# Patient Record
Sex: Female | Born: 1960 | Race: White | Hispanic: No | Marital: Single | Smoking: Never smoker
Health system: Southern US, Community
[De-identification: ages and names within clinical notes are randomized; demographics above are authoritative.]

## PROBLEM LIST (undated history)

## (undated) DIAGNOSIS — F32A Depression, unspecified: Secondary | ICD-10-CM

## (undated) DIAGNOSIS — I10 Essential (primary) hypertension: Secondary | ICD-10-CM

## (undated) DIAGNOSIS — F329 Major depressive disorder, single episode, unspecified: Secondary | ICD-10-CM

## (undated) DIAGNOSIS — K219 Gastro-esophageal reflux disease without esophagitis: Secondary | ICD-10-CM

## (undated) DIAGNOSIS — G629 Polyneuropathy, unspecified: Secondary | ICD-10-CM

---

## 2009-02-24 ENCOUNTER — Ambulatory Visit (HOSPITAL_COMMUNITY): Admission: RE | Admit: 2009-02-24 | Discharge: 2009-02-24 | Payer: Self-pay | Admitting: Gastroenterology

## 2010-10-02 IMAGING — US US ABDOMEN COMPLETE
1 series · 14 of 25 positions shown · non-contrast
Comparison: None

CLINICAL DATA: Nausea and upper abdominal pain.

COMPLETE ABDOMINAL ULTRASOUND

[Series 1: us abdomen complete · 0.28mm/px · 14 of 89 slices shown]
[im 1/89]
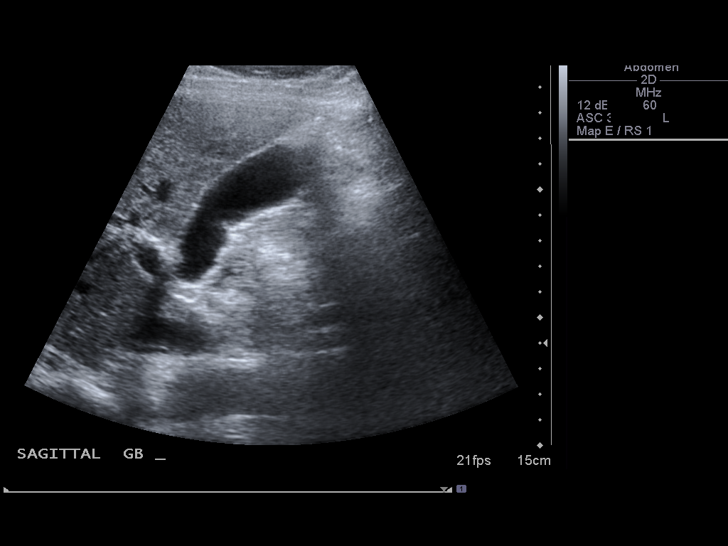
[im 8/89]
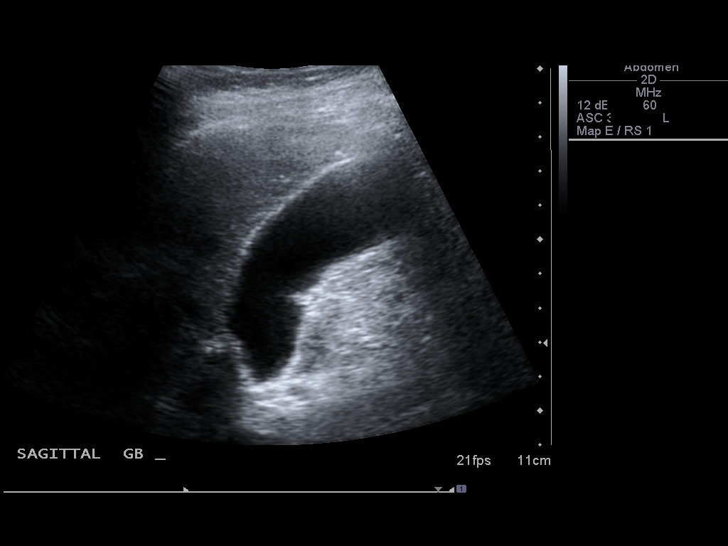
[im 15/89]
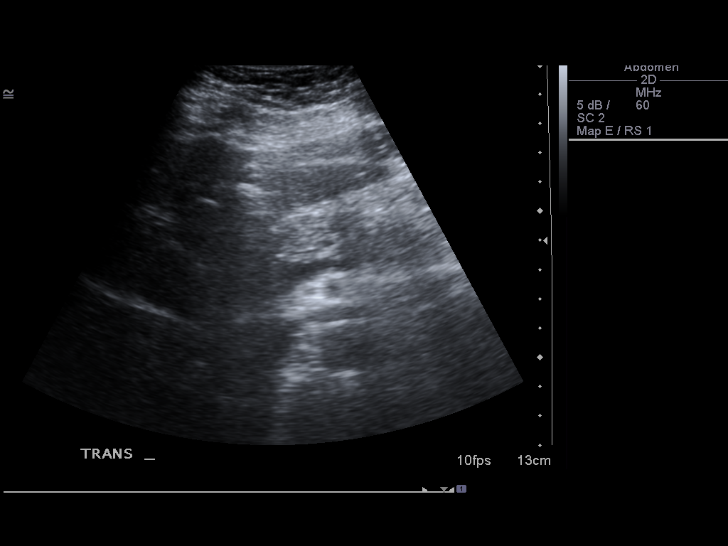
[im 23/89]
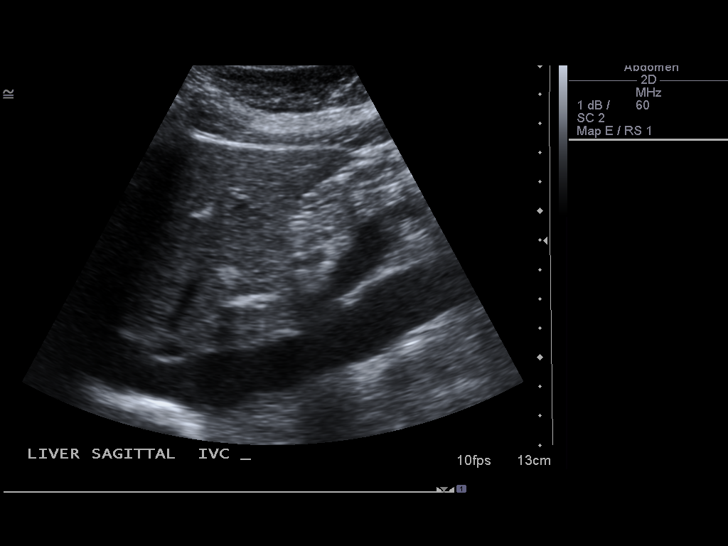
[im 30/89]
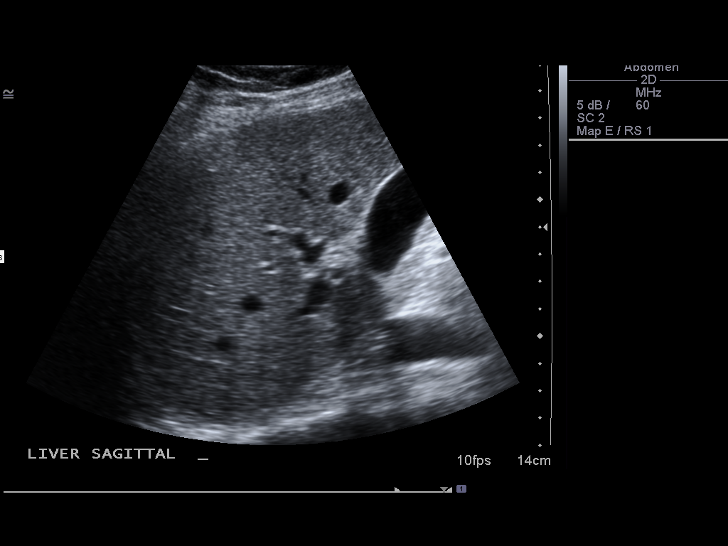
[im 34/89]
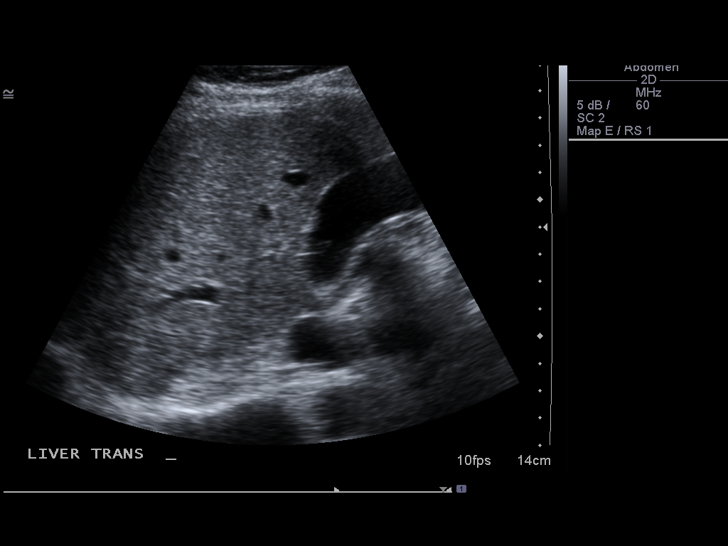
[im 41/89]
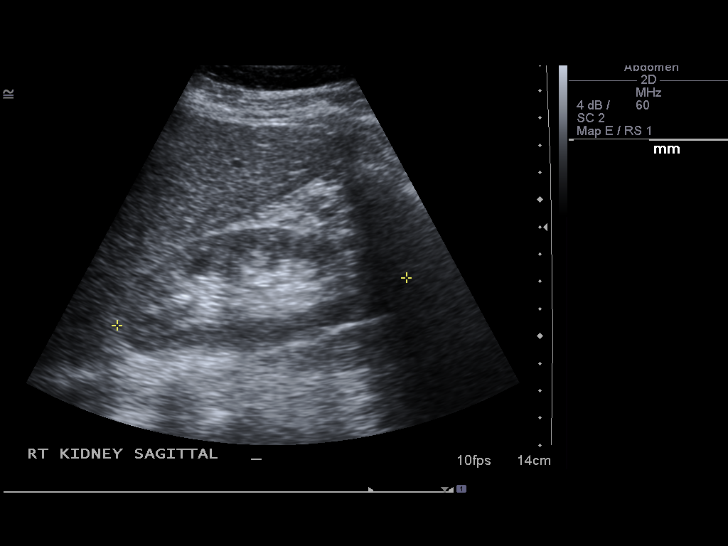
[im 48/89]
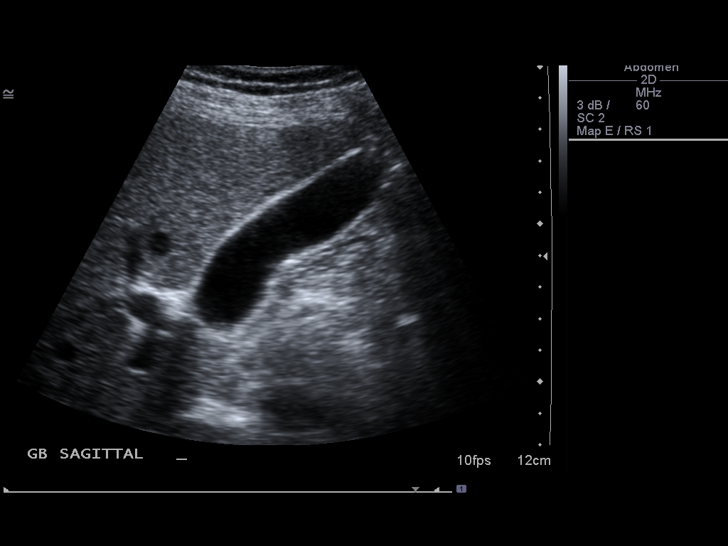
[im 56/89]
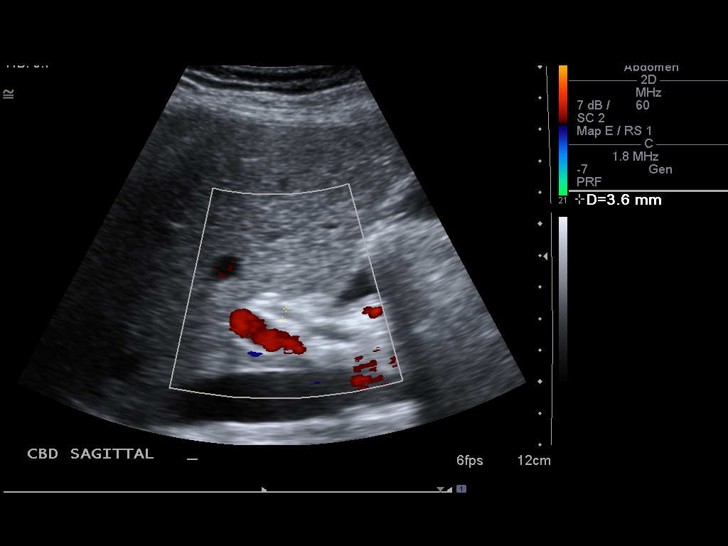
[im 59/89]
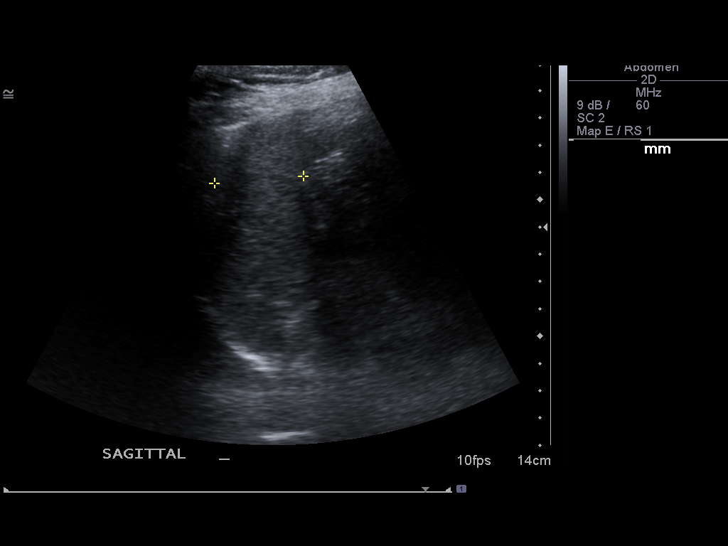
[im 67/89]
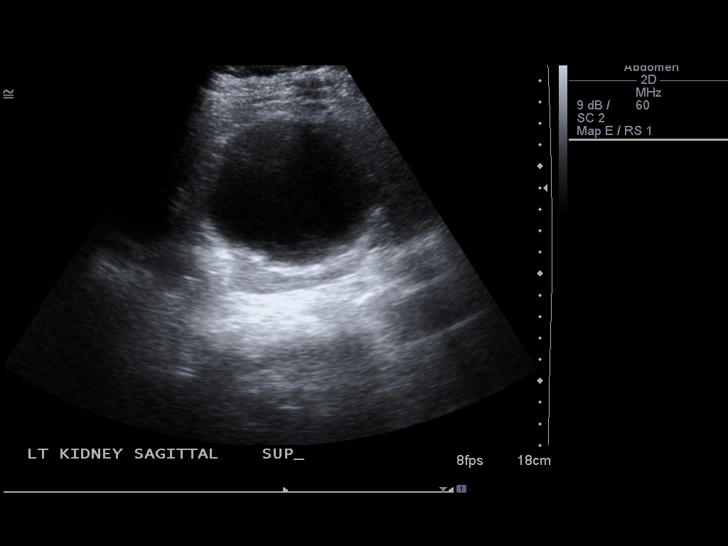
[im 74/89]
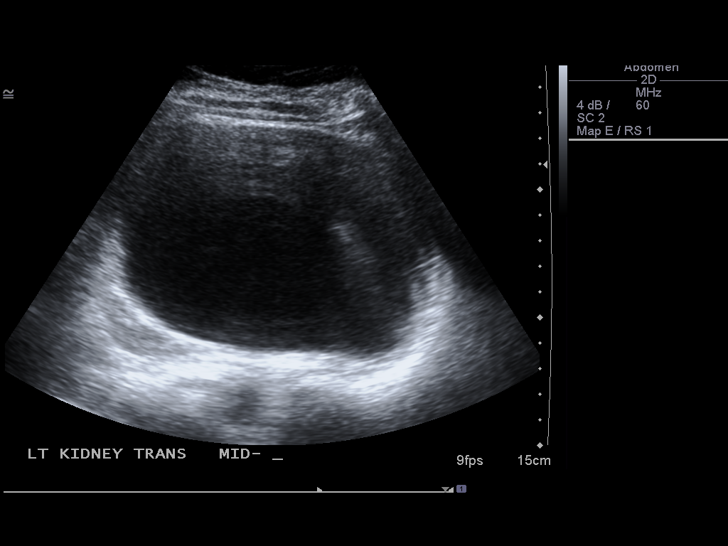
[im 81/89]
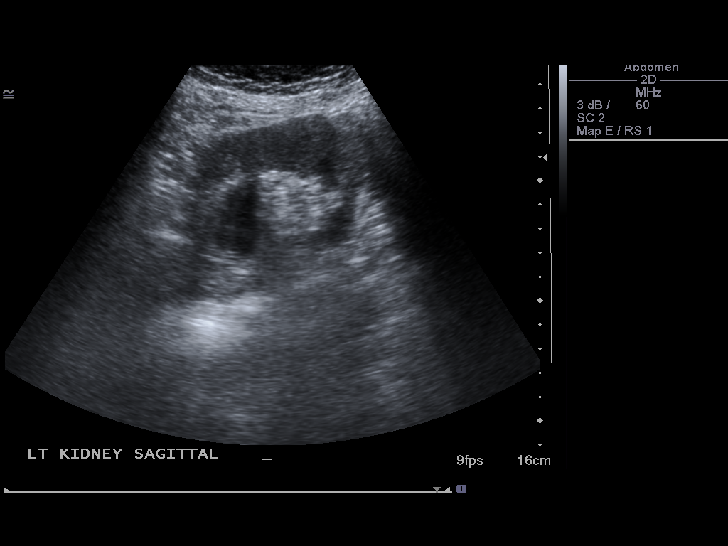
[im 89/89]
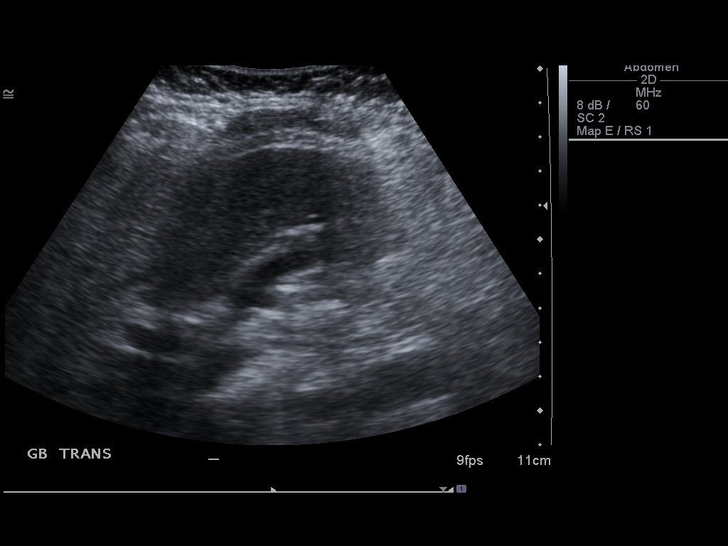

[14 of 25 positions shown; findings below may reference images not displayed]

FINDINGS: Gallbladder:  There is a prominent fold in the gallbladder but no
gallstones, wall thickening or pericholecystic fluid.  Negative
sonographic Murphy's sign.

Common bile duct:  Normal in caliber measuring a maximum of 3.6 mm.

Liver:  Normal echogenicity without focal lesions or intrahepatic
ductal dilatation.

IVC:  Normal caliber.

Pancreas:  Sonographically normal.

Spleen:  Normal in size and echogenicity without focal lesions.

Right Kidney:  10.8 cm in length. Normal renal cortical thickness
and echogenicity without focal lesions or hydronephrosis.

Left Kidney:  14.5 cm in length.  There is a 14 x 8 x 12 cm simple
appearing renal cyst.  No hydronephrosis.

Abdominal aorta:  Normal in caliber.
IMPRESSION: 1.  Normal sonographic appearance of the gallbladder and normal
caliber common bile duct.
2.  Normal sonographic appearance of the liver, pancreas, spleen
and right kidney.
3.  14 cm cyst associated with the left kidney.

## 2015-10-20 ENCOUNTER — Encounter (HOSPITAL_COMMUNITY): Payer: Self-pay

## 2015-10-20 ENCOUNTER — Emergency Department (HOSPITAL_COMMUNITY)
Admission: EM | Admit: 2015-10-20 | Discharge: 2015-10-21 | Disposition: A | Discharge status: Home / Self Care | Attending: Emergency Medicine | Admitting: Emergency Medicine

## 2015-10-20 ENCOUNTER — Emergency Department (HOSPITAL_COMMUNITY)

## 2015-10-20 DIAGNOSIS — Y999 Unspecified external cause status: Secondary | ICD-10-CM | POA: Insufficient documentation

## 2015-10-20 DIAGNOSIS — Y9301 Activity, walking, marching and hiking: Secondary | ICD-10-CM | POA: Insufficient documentation

## 2015-10-20 DIAGNOSIS — Y92009 Unspecified place in unspecified non-institutional (private) residence as the place of occurrence of the external cause: Secondary | ICD-10-CM | POA: Insufficient documentation

## 2015-10-20 DIAGNOSIS — S92912A Unspecified fracture of left toe(s), initial encounter for closed fracture: Secondary | ICD-10-CM

## 2015-10-20 DIAGNOSIS — Z79899 Other long term (current) drug therapy: Secondary | ICD-10-CM | POA: Insufficient documentation

## 2015-10-20 DIAGNOSIS — T1490XA Injury, unspecified, initial encounter: Secondary | ICD-10-CM

## 2015-10-20 DIAGNOSIS — W2203XA Walked into furniture, initial encounter: Secondary | ICD-10-CM | POA: Diagnosis not present

## 2015-10-20 DIAGNOSIS — S92512A Displaced fracture of proximal phalanx of left lesser toe(s), initial encounter for closed fracture: Secondary | ICD-10-CM | POA: Insufficient documentation

## 2015-10-20 DIAGNOSIS — I10 Essential (primary) hypertension: Secondary | ICD-10-CM | POA: Insufficient documentation

## 2015-10-20 DIAGNOSIS — S99922A Unspecified injury of left foot, initial encounter: Secondary | ICD-10-CM | POA: Diagnosis present

## 2015-10-20 HISTORY — DX: Gastro-esophageal reflux disease without esophagitis: K21.9

## 2015-10-20 HISTORY — DX: Polyneuropathy, unspecified: G62.9

## 2015-10-20 HISTORY — DX: Essential (primary) hypertension: I10

## 2015-10-20 NOTE — ED Provider Notes (Signed)
[facility] Provider Note   CSN: [id number] Arrival date & time: 10/20/15  2146   By signing my name below, I, Nelwyn Salisbury, attest that this documentation has been prepared under the direction and in the presence of non-physician practitioner, Arvilla Meres, PA-C. Electronically Signed: Nelwyn Salisbury, Scribe. 10/20/2015. 11:15 PM.   History   Chief Complaint Chief Complaint  Patient presents with  . Toe Injury   The history is provided by the patient. No language interpreter was used.    HPI Comments:  Kristina Strong is a 55 y.o. female with PMHx of neuropathy who presents to the Emergency Department complaining of sudden-onset constant left fifth toe pain beginning today. Pt states that she was walking around her house when she stubbed her toe on some furniture. She states that her pain is worsened by palpation and walking. When asking about alleviating factors, pt notes she tried ibuprofen and epsom salts with minimal relief. She denies any numbness or weakness.   Past Medical History:  Diagnosis Date  . GERD (gastroesophageal reflux disease)   . Hypertension   . Neuropathy (HCC)     There are no active problems to display for this patient.   History reviewed. No pertinent surgical history.  OB History    No data available       Home Medications    Prior to Admission medications   Medication Sig Start Date End Date Taking? Authorizing Provider  HYDROcodone-acetaminophen (NORCO/VICODIN) 5-325 MG tablet Take 1 tablet by mouth every 8 (eight) hours as needed for severe pain. 10/21/15   Lona Kettle, PA-C    Family History No family history on file.  Social History Social History  Substance Use Topics  . Smoking status: Never Smoker  . Smokeless tobacco: Never Used  . Alcohol use No     Allergies   Azithromycin   Review of Systems Review of Systems  Constitutional: Negative for fever.  Skin: Positive for color change and wound.    Neurological: Negative for weakness and numbness.     Physical Exam Updated Vital Signs BP 137/84 (BP Location: Left Arm)   Pulse 61   Temp 97.5 F (36.4 C) (Oral)   Resp 16   Ht 5\' 7"  (1.702 m)   Wt 63.5 kg   SpO2 98%   BMI 21.93 kg/m   Physical Exam  Constitutional: She appears well-developed and well-nourished. No distress.  HENT:  Head: Normocephalic and atraumatic.  Eyes: Conjunctivae are normal. No scleral icterus.  Neck: Normal range of motion.  Cardiovascular: Normal rate and intact distal pulses.   Pulmonary/Chest: Effort normal. No respiratory distress.  Abdominal: She exhibits no distension.  Musculoskeletal: She exhibits tenderness and deformity.  Left fifth toe has swelling. Tender to palpation. Angulated down. Good capillary refill. Sensation intact.  Neurological: She is alert. She is not disoriented.  Skin: Skin is warm and dry. Capillary refill takes less than 2 seconds. She is not diaphoretic.  Psychiatric: She has a normal mood and affect. Her behavior is normal.  Nursing note and vitals reviewed.    ED Treatments / Results  DIAGNOSTIC STUDIES:  Oxygen Saturation is 98% on RA, normal by my interpretation.    COORDINATION OF CARE:  11:26 PM Discussed treatment plan with pt at bedside which included imaging, splint, and pain killers and pt agreed to plan.  Labs (all labs ordered are listed, but only abnormal results are displayed) Labs Reviewed - No data to display  EKG  EKG Interpretation None  Radiology Dg Foot Complete Left  Result Date: 10/20/2015 CLINICAL DATA:  Left fifth toe injury. Struck a wall tonight. Pain. EXAM: LEFT FOOT - COMPLETE 3+ VIEW COMPARISON:  None. FINDINGS: Oblique fracture of the proximal phalanx left fifth toe with mild overriding and lateral angulation of the distal fracture fragment. No intra-articular involvement is noted. Soft tissue swelling is present. Left foot appears otherwise intact. IMPRESSION:  Acute fracture of the proximal phalanx left fifth toe without articular involvement. Electronically Signed   By: Burman NievesWilliam  Stevens M.D.   On: 10/20/2015 22:29    Procedures Procedures (including critical care time)  Medications Ordered in ED Medications - No data to display   Initial Impression / Assessment and Plan / ED Course  I have reviewed the triage vital signs and the nursing notes.  Pertinent labs & imaging results that were available during my care of the patient were reviewed by me and considered in my medical decision making (see chart for details).  Clinical Course  Value Comment By Time  DG Foot Complete Left Fracture of left 5th proximal phalanx. Lona Kettleshley Laurel Meyer, [place]-C 09/19 2300    Patient presents to ED with complaint of left 5th toe pain. Patient is afebrile and non-toxic appearing in NAD. VSS. 5th toe angulated down with associated swelling, TTP. Neurovascularly intact. Patient X-Ray positive for obvious fracture. Discussed results and plan with pt. 5th and 4th phalanx buddy taped, pt placed in post-op shoe, and crutches provided. Pt advised to follow up with orthopedics, contact information provided.Conservative therapy recommended and discussed. Rx vicodin for severe pain. Review of [place] controlled substance database shows no recent narcotic rx.  Returns precautions discussed. Pt voiced understanding and is agreeable.   Final Clinical Impressions(s) / ED Diagnoses   Final diagnoses:  Toe fracture, left, closed, initial encounter    New Prescriptions Discharge Medication List as of 10/21/2015 12:03 AM    START taking these medications   Details  HYDROcodone-acetaminophen (NORCO/VICODIN) 5-325 MG tablet Take 1 tablet by mouth every 8 (eight) hours as needed for severe pain., Starting Wed 10/21/2015, Print      I personally performed the services described in this documentation, which was scribed in my presence. The recorded information has been reviewed and is  accurate.     Lona Kettleshley Laurel Meyer, PA-C 10/22/15 1750    Alvira MondayErin Schlossman, MD 10/25/15 2116

## 2015-10-20 NOTE — ED Notes (Signed)
Ortho tech paged and returned call.  En route to ED. 

## 2015-10-20 NOTE — ED Triage Notes (Signed)
Pt states that she hit he pinky toe on L foot on a chair and is now unable to walk on it. Toe is bent outward. Unable to control pain at home.

## 2015-10-21 MED ORDER — HYDROCODONE-ACETAMINOPHEN 5-325 MG PO TABS: 1.0000 | ORAL_TABLET | Freq: Three times a day (TID) | ORAL | 0 refills | Status: AC | PRN

## 2015-10-21 NOTE — Discharge Instructions (Signed)
Read the information below.  You have a fracture of your 5th digit. The toes are being taped and you are being placed in a shoe to minimize mobility. Use crutches when walking.  For the next 24-48 hours be sure to ice and elevate your leg for 20 minute increments.  You can take motrin 400mg  every 6hrs or tylenol 650mg  every 6hrs for pain relief.  I have prescribed vicodin for severe pain.  It is very important that you follow up with orthopedics for further evaluation and management. [facility] provided above.  Use the prescribed medication as directed.  Please discuss all new medications with your pharmacist.   You may return to the Emergency Department at any time for worsening condition or any new symptoms that concern you. Return to ED if you develop numbness, changes in temperature, severe pain.

## 2015-10-21 NOTE — Progress Notes (Signed)
Orthopedic Tech Progress Note Patient Details:  Kristina ModenaKimberly Strong [birth year] [id number]  Ortho Devices Type of Ortho Device: Buddy tape, Crutches, Postop shoe/boot Ortho Device/Splint Location: lle 5th toe Ortho Device/Splint Interventions: Ordered, Application   Trinna PostMartinez, Eldridge Marcott J 10/21/2015, 12:16 AM

## 2016-10-07 ENCOUNTER — Encounter (HOSPITAL_COMMUNITY): Payer: Self-pay

## 2016-10-07 ENCOUNTER — Ambulatory Visit (HOSPITAL_COMMUNITY)
Admission: EM | Admit: 2016-10-07 | Discharge: 2016-10-07 | Disposition: A | Discharge status: Home / Self Care | Attending: Emergency Medicine | Admitting: Emergency Medicine

## 2016-10-07 DIAGNOSIS — R42 Dizziness and giddiness: Secondary | ICD-10-CM

## 2016-10-07 DIAGNOSIS — H6123 Impacted cerumen, bilateral: Secondary | ICD-10-CM

## 2016-10-07 HISTORY — DX: Major depressive disorder, single episode, unspecified: F32.9

## 2016-10-07 HISTORY — DX: Depression, unspecified: F32.A

## 2016-10-07 MED ORDER — FLUTICASONE PROPIONATE 50 MCG/ACT NA SUSP: 2.0000 | Freq: Every day | NASAL | 0 refills | Status: AC

## 2016-10-07 MED ORDER — CETIRIZINE-PSEUDOEPHEDRINE ER 5-120 MG PO TB12: 1.0000 | ORAL_TABLET | Freq: Every day | ORAL | 0 refills | Status: AC

## 2016-10-07 MED ORDER — ONDANSETRON HCL 4 MG PO TABS: 4.0000 mg | ORAL_TABLET | Freq: Four times a day (QID) | ORAL | 0 refills | Status: AC

## 2016-10-07 MED ORDER — MECLIZINE HCL 25 MG PO TABS: 25.0000 mg | ORAL_TABLET | Freq: Three times a day (TID) | ORAL | 0 refills | Status: AC | PRN

## 2016-10-07 NOTE — ED Provider Notes (Signed)
[facility]    CSN: [id number] Arrival date & time: 10/07/16  4098     History   Chief Complaint Chief Complaint  Patient presents with  . Dizziness    HPI Kristina Strong is a 56 y.o. female.   56 year old female with history of hypertension, hyperlipidemia, comes in for one-week history of dizziness. Patient states dizziness is intermittent, and can last about 2-3 hours. She describes the dizziness as imbalance as opposed to a spinning sensation. She states she had initially thought it was due to changes in blood pressure medication, given she was taking the correct dose. But she has since corrected that with PCP, and the intermittent dizziness still persists. Denies fever, chills, night sweats. Denies URI symptoms such as cough, congestion, ear pain, eye pain. Denies chest pain, shortness of breath, weakness, dyspnea on exertion, swelling of the leg, syncope. She states she has a history of vertigo in the past.      Past Medical History:  Diagnosis Date  . Depression   . GERD (gastroesophageal reflux disease)   . Hypertension   . Neuropathy     There are no active problems to display for this patient.   History reviewed. No pertinent surgical history.  OB History    No data available       Home Medications    Prior to Admission medications   Medication Sig Start Date End Date Taking? Authorizing Provider  DULoxetine (CYMBALTA) 60 MG capsule Take 60 mg by mouth daily.   Yes [provider]  gabapentin (NEURONTIN) 100 MG capsule Take 100 mg by mouth 3 (three) times daily.   Yes [provider]  Metoprolol Succinate 25 MG CS24 Take by mouth.   Yes [provider]  simvastatin (ZOCOR) 10 MG tablet Take 10 mg by mouth daily.   Yes [provider]  cetirizine-pseudoephedrine (ZYRTEC-D) 5-120 MG tablet Take 1 tablet by mouth daily. 10/07/16   Cathie Hoops, Amy V, PA-C  fluticasone (FLONASE) 50 MCG/ACT nasal spray Place 2 sprays into  both nostrils daily. 10/07/16   Cathie Hoops, Amy V, PA-C  HYDROcodone-acetaminophen (NORCO/VICODIN) 5-325 MG tablet Take 1 tablet by mouth every 8 (eight) hours as needed for severe pain. 10/21/15   Deborha Payment, PA-C  meclizine (ANTIVERT) 25 MG tablet Take 1 tablet (25 mg total) by mouth 3 (three) times daily as needed for dizziness. 10/07/16   Cathie Hoops, Amy V, PA-C  ondansetron (ZOFRAN) 4 MG tablet Take 1 tablet (4 mg total) by mouth every 6 (six) hours. 10/07/16   Belinda Fisher, PA-C    Family History No family history on file.  Social History Social History  Substance Use Topics  . Smoking status: Never Smoker  . Smokeless tobacco: Never Used  . Alcohol use No     Allergies   Azithromycin   Review of Systems Review of Systems  Reason unable to perform ROS: See HPI as above.     Physical Exam Triage Vital Signs ED Triage Vitals  Enc Vitals Group     BP 10/07/16 2004 124/83     Pulse Rate 10/07/16 2004 63     Resp 10/07/16 2004 18     Temp 10/07/16 2004 97.9 F (36.6 C)     Temp Source 10/07/16 2004 Oral     SpO2 10/07/16 2004 95 %     Weight --      Height --      Head Circumference --      Peak  Flow --      Pain Score 10/07/16 2007 0     Pain Loc --      Pain Edu? --      Excl. in GC? --    No data found.   Updated Vital Signs BP 124/83 (BP Location: Left Arm)   Pulse 63   Temp 97.9 F (36.6 C) (Oral)   Resp 18   SpO2 95%   Physical Exam  Constitutional: She is oriented to person, place, and time. She appears well-developed and well-nourished. No distress.  HENT:  Head: Normocephalic and atraumatic.  Right Ear: External ear normal.  Left Ear: External ear normal.  Nose: Nose normal. Right sinus exhibits no maxillary sinus tenderness and no frontal sinus tenderness. Left sinus exhibits no maxillary sinus tenderness and no frontal sinus tenderness.  Mouth/Throat: Uvula is midline, oropharynx is clear and moist and mucous membranes are normal.  Cerumen impaction  bilaterally. TM not visible.   After irrigation, right ear with with mid ear effusion. Left ear TM slightly opaque.   Eyes: Pupils are equal, round, and reactive to light. Conjunctivae are normal.  Neck: Normal range of motion. Neck supple.  Cardiovascular: Normal rate, regular rhythm and normal heart sounds.  Exam reveals no gallop and no friction rub.   No murmur heard. Pulmonary/Chest: Effort normal and breath sounds normal. She has no decreased breath sounds. She has no wheezes. She has no rhonchi. She has no rales.  Lymphadenopathy:    She has no cervical adenopathy.  Neurological: She is alert and oriented to person, place, and time.  Skin: Skin is warm and dry.  Psychiatric: She has a normal mood and affect. Her behavior is normal. Judgment normal.     UC Treatments / Results  Labs (all labs ordered are listed, but only abnormal results are displayed) Labs Reviewed - No data to display  EKG  EKG Interpretation None       Radiology No results found.  Procedures Procedures (including critical care time)  Medications Ordered in UC Medications - No data to display   Initial Impression / Assessment and Plan / UC Course  I have reviewed the triage vital signs and the nursing notes.  Pertinent labs & imaging results that were available during my care of the patient were reviewed by me and considered in my medical decision making (see chart for details).    Discussed with patient no alarming signs today. Patient with normal neuro exam with negative romberg, and without chest pain, shortness of breath, weakness, syncope. Given ear exam, will treat for nasal congestion. Meclizine for dizziness, and zofran for N/V as needed. Return precautions given.   Final Clinical Impressions(s) / UC Diagnoses   Final diagnoses:  Bilateral impacted cerumen  Dizziness    New Prescriptions Discharge Medication List as of 10/07/2016  9:29 PM    START taking these medications    Details  cetirizine-pseudoephedrine (ZYRTEC-D) 5-120 MG tablet Take 1 tablet by mouth daily., Starting Fri 10/07/2016, Normal    fluticasone (FLONASE) 50 MCG/ACT nasal spray Place 2 sprays into both nostrils daily., Starting Fri 10/07/2016, Normal    meclizine (ANTIVERT) 25 MG tablet Take 1 tablet (25 mg total) by mouth 3 (three) times daily as needed for dizziness., Starting Fri 10/07/2016, Normal    ondansetron (ZOFRAN) 4 MG tablet Take 1 tablet (4 mg total) by mouth every 6 (six) hours., Starting Fri 10/07/2016, Normal          Yu, Amy V,  PA-C 10/07/16 2337    Belinda FisherYu, Amy V, PA-C 10/07/16 2338

## 2016-10-07 NOTE — ED Triage Notes (Signed)
Pt has been having dizziness for over a week along with nausea was taking half her dose of her bp medication and thought it was that. But does have a hx of vertigo years ago and was given medication for it. Is leaving for a trip soon so wanted to get it checked out.

## 2017-05-27 IMAGING — DX DG FOOT COMPLETE 3+V*L*
4 series · 4 of 4 positions shown · non-contrast
Comparison: None.

CLINICAL DATA: Left fifth toe injury. Struck a wall tonight. Pain.

EXAM:
LEFT FOOT - COMPLETE 3+ VIEW

[foot ap]
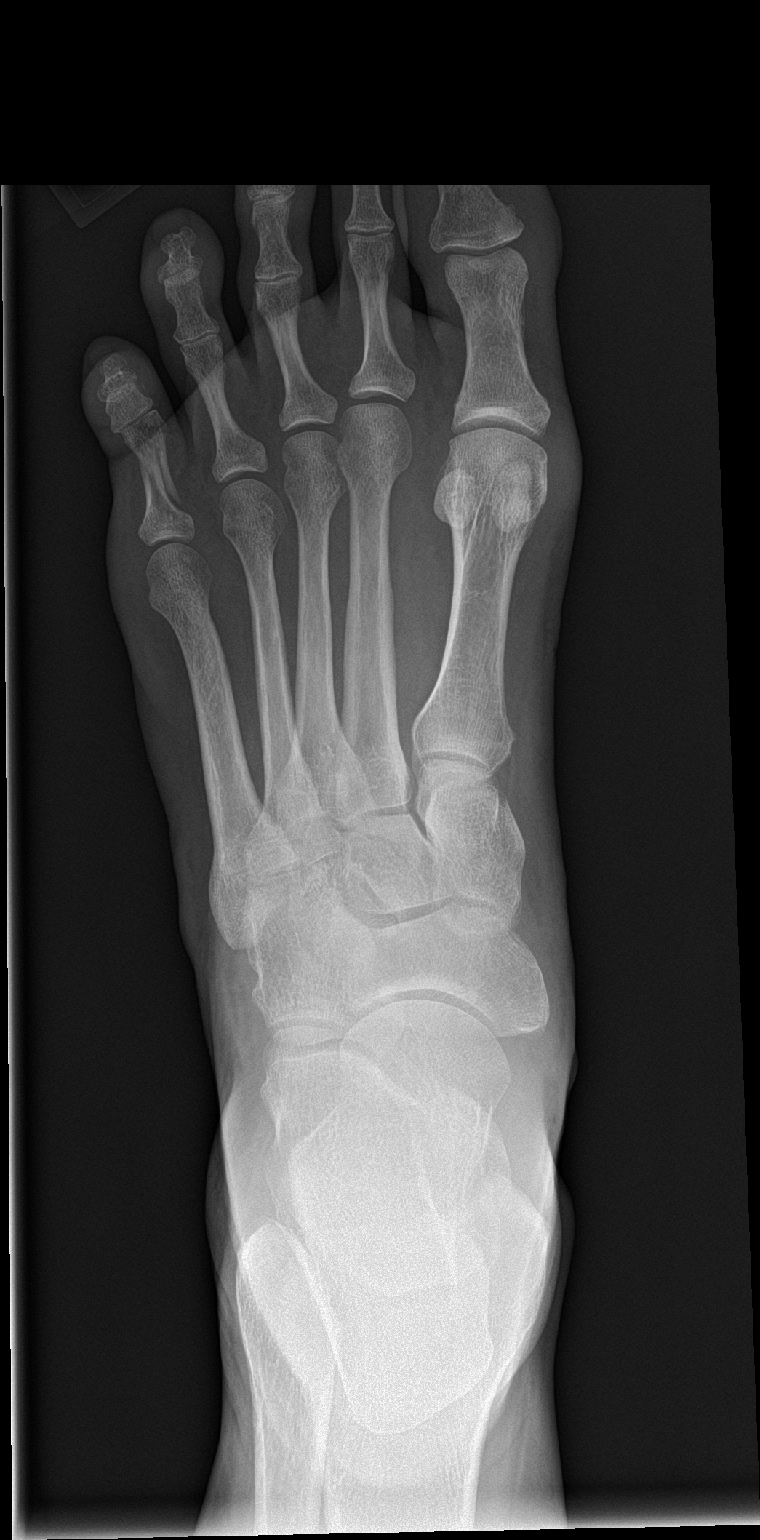

[foot lat (1 of 2)]
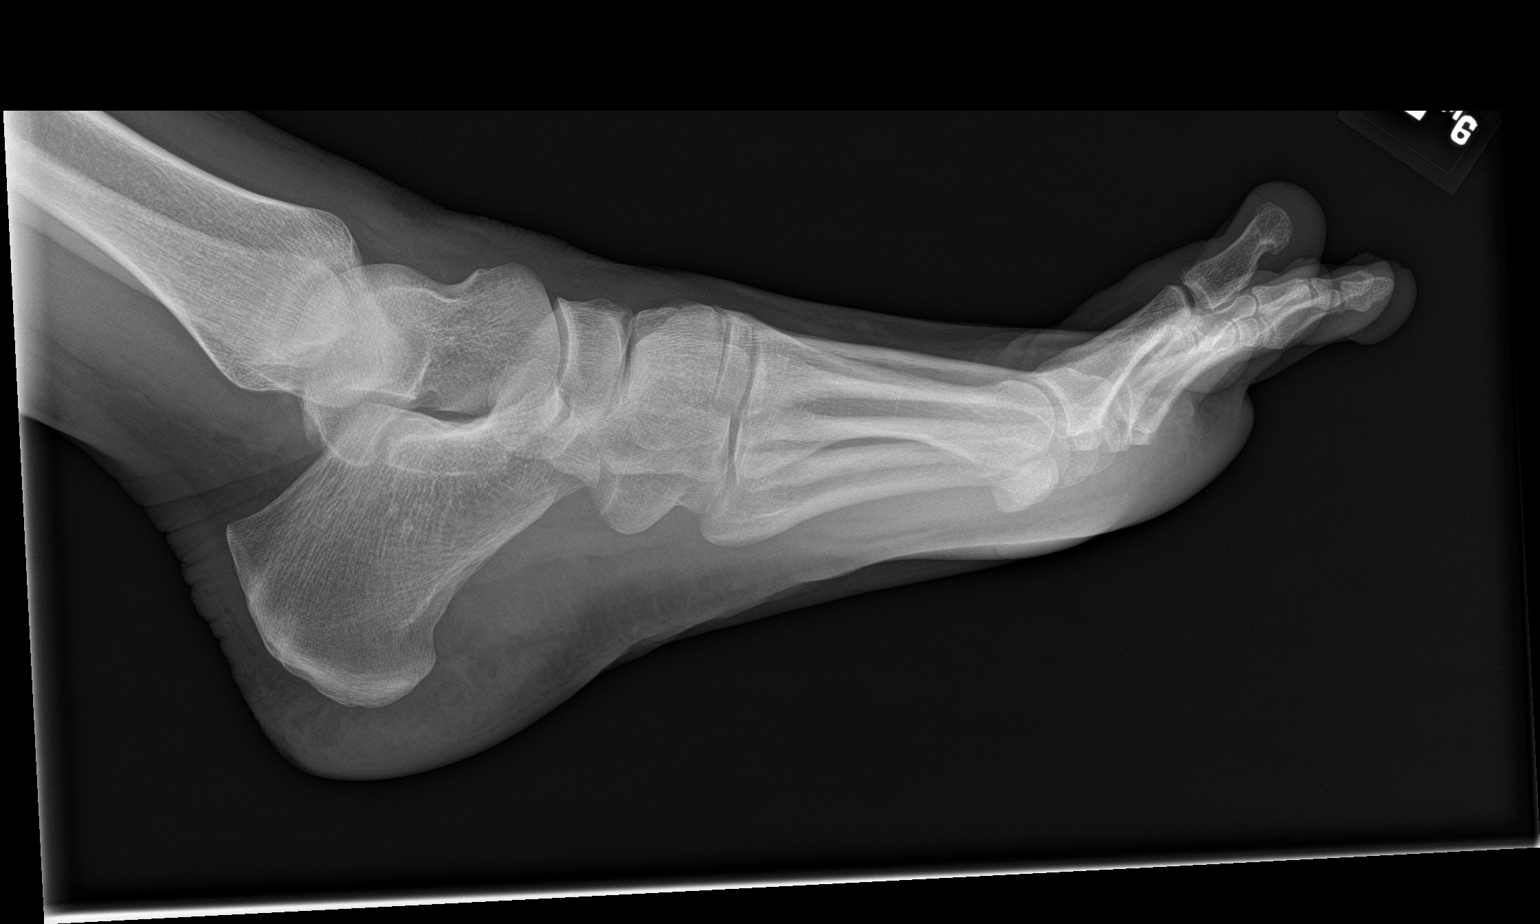

[foot obl]
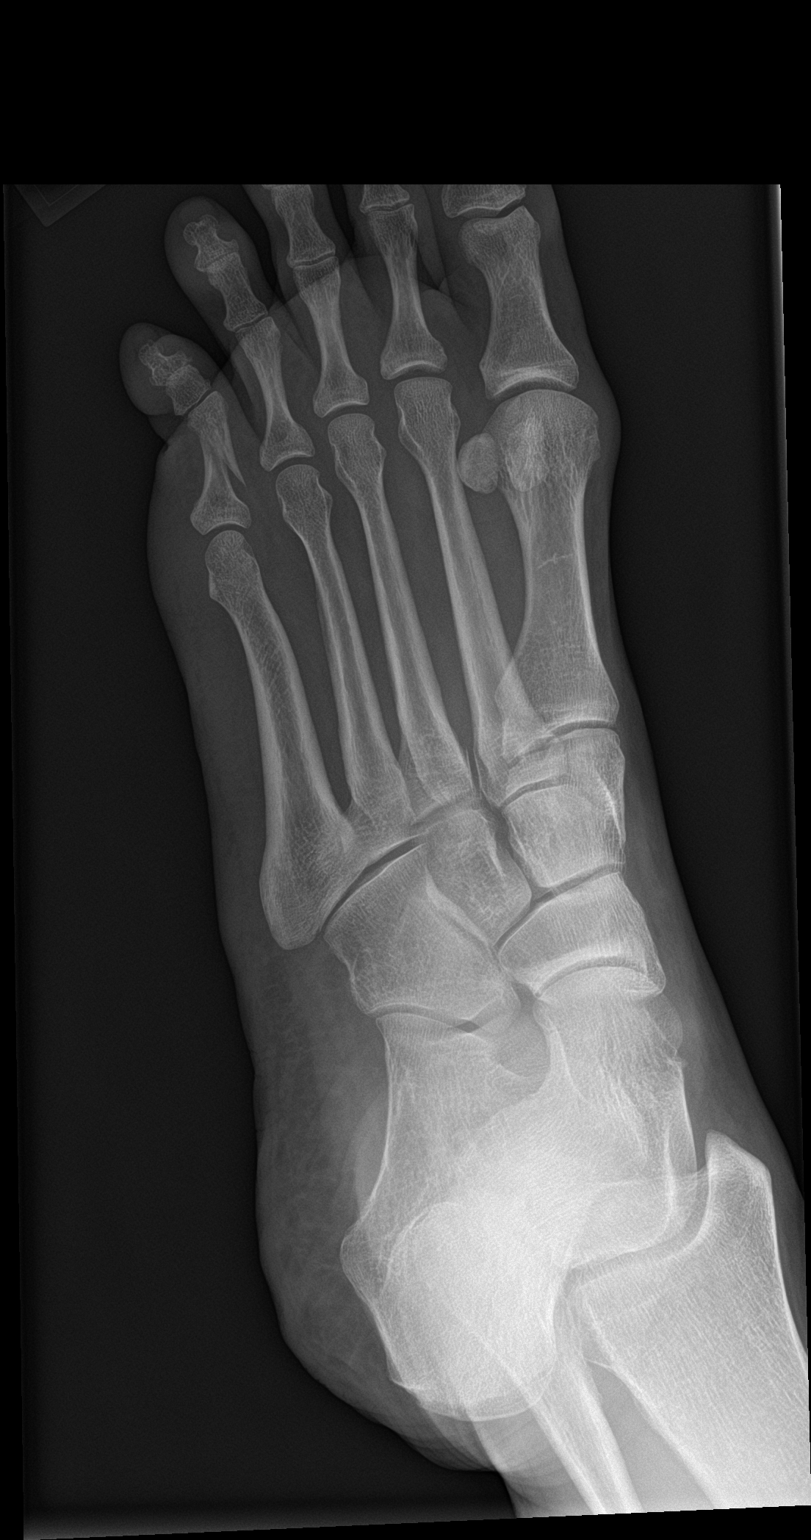

[foot lat (2 of 2)]
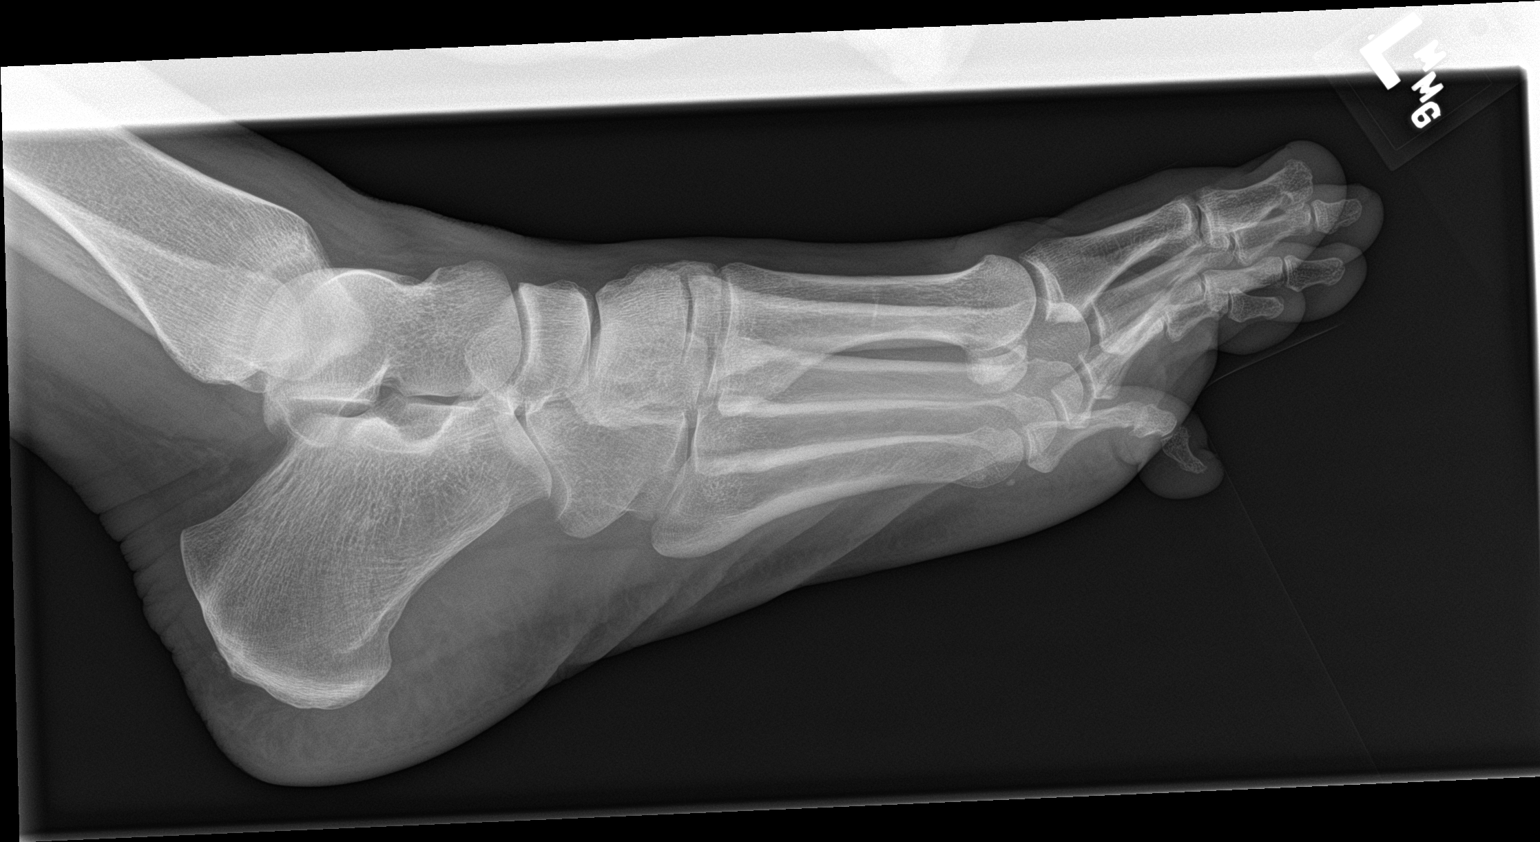

[4 of 4 positions shown; findings below may reference images not displayed]

FINDINGS: Oblique fracture of the proximal phalanx left fifth toe with mild
overriding and lateral angulation of the distal fracture fragment.
No intra-articular involvement is noted. Soft tissue swelling is
present. Left foot appears otherwise intact.
IMPRESSION: Acute fracture of the proximal phalanx left fifth toe without
articular involvement.

## 2017-06-17 ENCOUNTER — Encounter (HOSPITAL_COMMUNITY): Payer: Self-pay | Admitting: Emergency Medicine

## 2017-06-17 ENCOUNTER — Ambulatory Visit (HOSPITAL_COMMUNITY)
Admission: EM | Admit: 2017-06-17 | Discharge: 2017-06-17 | Disposition: A | Discharge status: Home / Self Care | Attending: Internal Medicine | Admitting: Internal Medicine

## 2017-06-17 DIAGNOSIS — S91311A Laceration without foreign body, right foot, initial encounter: Secondary | ICD-10-CM | POA: Diagnosis not present

## 2017-06-17 DIAGNOSIS — W2209XA Striking against other stationary object, initial encounter: Secondary | ICD-10-CM | POA: Diagnosis not present

## 2017-06-17 NOTE — ED Triage Notes (Signed)
Pt here with laceration to bottom of right foot from door jam

## 2017-06-17 NOTE — Discharge Instructions (Signed)
Please monitor for signs of infection, redness, swelling, drainage or increased pain.  We applied skin glue this will gradually come off on its own in 5 to 10 days  Please try to keep area as clean as possible.

## 2017-06-18 DIAGNOSIS — S91311A Laceration without foreign body, right foot, initial encounter: Secondary | ICD-10-CM | POA: Diagnosis not present

## 2017-06-18 NOTE — ED Provider Notes (Signed)
[facility]    CSN: [id number] Arrival date & time: 06/17/17  1947     History   Chief Complaint Chief Complaint  Patient presents with  . Laceration    HPI Kristina Strong is a 57 y.o. female history of hypertension presenting today for evaluation of laceration to her right foot.  Patient states that she was walking in her home and accidentally hit her foot on the trim/molding of a door, this happened today.  States it was bleeding significantly, but controlled at time of visit.  Denies any numbness or tingling.  Pain with weightbearing.  HPI  Past Medical History:  Diagnosis Date  . Depression   . GERD (gastroesophageal reflux disease)   . Hypertension   . Neuropathy     There are no active problems to display for this patient.   History reviewed. No pertinent surgical history.  OB History   None      Home Medications    Prior to Admission medications   Medication Sig Start Date End Date Taking? Authorizing Provider  cetirizine-pseudoephedrine (ZYRTEC-D) 5-120 MG tablet Take 1 tablet by mouth daily. 10/07/16   Cathie Hoops, Amy V, PA-C  DULoxetine (CYMBALTA) 60 MG capsule Take 60 mg by mouth daily.    [provider]  fluticasone (FLONASE) 50 MCG/ACT nasal spray Place 2 sprays into both nostrils daily. 10/07/16   Cathie Hoops, Amy V, PA-C  gabapentin (NEURONTIN) 100 MG capsule Take 100 mg by mouth 3 (three) times daily.    [provider]  HYDROcodone-acetaminophen (NORCO/VICODIN) 5-325 MG tablet Take 1 tablet by mouth every 8 (eight) hours as needed for severe pain. 10/21/15   Deborha Payment, PA-C  meclizine (ANTIVERT) 25 MG tablet Take 1 tablet (25 mg total) by mouth 3 (three) times daily as needed for dizziness. 10/07/16   Cathie Hoops, Amy V, PA-C  Metoprolol Succinate 25 MG CS24 Take by mouth.    [provider]  ondansetron (ZOFRAN) 4 MG tablet Take 1 tablet (4 mg total) by mouth every 6 (six) hours. 10/07/16   Cathie Hoops, Amy V, PA-C  simvastatin (ZOCOR) 10 MG  tablet Take 10 mg by mouth daily.    [provider]    Family History History reviewed. No pertinent family history.  Social History Social History   Tobacco Use  . Smoking status: Never Smoker  . Smokeless tobacco: Never Used  Substance Use Topics  . Alcohol use: No  . Drug use: Not on file     Allergies   Azithromycin   Review of Systems Review of Systems  Musculoskeletal: Positive for gait problem.  Skin: Positive for wound. Negative for color change and pallor.  Neurological: Negative for weakness and numbness.     Physical Exam Triage Vital Signs ED Triage Vitals [06/17/17 2042]  Enc Vitals Group     BP 135/86     Pulse Rate 92     Resp 18     Temp 98.3 F (36.8 C)     Temp Source Oral     SpO2 98 %     Weight      Height      Head Circumference      Peak Flow      Pain Score      Pain Loc      Pain Edu?      Excl. in GC?    No data found.  Updated Vital Signs BP 135/86 (BP Location: Left Arm)   Pulse 92  Temp 98.3 F (36.8 C) (Oral)   Resp 18   SpO2 98%   Visual Acuity Right Eye Distance:   Left Eye Distance:   Bilateral Distance:    Right Eye Near:   Left Eye Near:    Bilateral Near:     Physical Exam  Constitutional: She appears well-developed and well-nourished. No distress.  HENT:  Head: Normocephalic and atraumatic.  Neck: Neck supple.  Cardiovascular: Normal rate.  No murmur heard. Pulmonary/Chest: Effort normal. No respiratory distress.  Musculoskeletal: She exhibits no edema.  Neurological: She is alert.  Skin: Skin is warm and dry.  2 cm area to plantar surface of foot, small flap of skin, relatively superficial  Psychiatric: She has a normal mood and affect.  Nursing note and vitals reviewed.    UC Treatments / Results  Labs (all labs ordered are listed, but only abnormal results are displayed) Labs Reviewed - No data to display  EKG None  Radiology No results found.  Procedures Laceration  Repair Date/Time: 06/18/2017 9:38 AM Performed by: Wieters, Junius Creamer, PA-C Authorized by: Isa Rankin, MD   Consent:    Consent obtained:  Verbal   Consent given by:  Patient   Risks discussed:  Infection, pain, poor cosmetic result and poor wound healing   Alternatives discussed:  No treatment Anesthesia (see MAR for exact dosages):    Anesthesia method:  None Laceration details:    Location:  Foot   Foot location:  Sole of R foot   Length (cm):  2   Depth (mm):  2 Repair type:    Repair type:  Simple Exploration:    Hemostasis achieved with:  Direct pressure   Wound exploration: wound explored through full range of motion     Wound extent: no foreign bodies/material noted and no muscle damage noted   Treatment:    Area cleansed with:  Shur-Clens   Amount of cleaning:  Standard   Visualized foreign bodies/material removed: no   Skin repair:    Repair method:  Tissue adhesive Approximation:    Approximation:  Loose Post-procedure details:    Dressing:  Non-adherent dressing   Patient tolerance of procedure:  Tolerated well, no immediate complications Comments:     Flap of skin reapproximated prior to application of tissue adhesive   (including critical care time)  Medications Ordered in UC Medications - No data to display  Initial Impression / Assessment and Plan / UC Course  I have reviewed the triage vital signs and the nursing notes.  Pertinent labs & imaging results that were available during my care of the patient were reviewed by me and considered in my medical decision making (see chart for details).     Dermabond applied, discussed signs and symptoms to watch out for infection.  Discussed wound management.  Advised Tylenol and ibuprofen for pain.  Discussed strict return precautions. Patient verbalized understanding and is agreeable with plan.  Final Clinical Impressions(s) / UC Diagnoses   Final diagnoses:  Laceration of right foot, initial  encounter     Discharge Instructions     Please monitor for signs of infection, redness, swelling, drainage or increased pain.  We applied skin glue this will gradually come off on its own in 5 to 10 days  Please try to keep area as clean as possible.   ED Prescriptions    None     Controlled Substance Prescriptions [place] Controlled Substance Registry consulted? Not Applicable   Wieters, Shallowater C, PA-C  06/18/17 0940
